# Patient Record
Sex: Male | Born: 1955 | Race: White | Hispanic: No | Marital: Single | Smoking: Former smoker
Health system: Southern US, Community
[De-identification: ages and names within clinical notes are randomized; demographics above are authoritative.]

## PROBLEM LIST (undated history)

## (undated) DIAGNOSIS — G8929 Other chronic pain: Secondary | ICD-10-CM

## (undated) DIAGNOSIS — M549 Dorsalgia, unspecified: Secondary | ICD-10-CM

## (undated) DIAGNOSIS — E785 Hyperlipidemia, unspecified: Secondary | ICD-10-CM

## (undated) HISTORY — DX: Hyperlipidemia, unspecified: E78.5

---

## 2008-05-31 ENCOUNTER — Encounter: Admission: RE | Admit: 2008-05-31 | Discharge: 2008-05-31 | Payer: Self-pay | Admitting: Orthopaedic Surgery

## 2008-06-22 ENCOUNTER — Ambulatory Visit (HOSPITAL_BASED_OUTPATIENT_CLINIC_OR_DEPARTMENT_OTHER): Admission: RE | Admit: 2008-06-22 | Discharge: 2008-06-22 | Payer: Self-pay | Admitting: Orthopaedic Surgery

## 2008-06-22 ENCOUNTER — Encounter (INDEPENDENT_AMBULATORY_CARE_PROVIDER_SITE_OTHER): Payer: Self-pay | Admitting: Orthopaedic Surgery

## 2010-11-14 NOTE — Op Note (Signed)
NAMELEOTHA, VOELTZ                ACCOUNT NO.:  [id number]   MEDICAL RECORD NO.:  [id number]          PATIENT TYPE:  AMB   LOCATION:  DSC                          FACILITY:  [facility]   PHYSICIAN:  Lubertha Basque. Dalldorf, M.D.DATE OF BIRTH:  Aug 03, 1955   DATE OF PROCEDURE:  06/22/2008  DATE OF DISCHARGE:                               OPERATIVE REPORT   PREOPERATIVE DIAGNOSIS:  Left low back lipoma.   POSTOPERATIVE DIAGNOSIS:  Left low back lipoma.   PROCEDURE:  Excision of lipoma, low back area.   ANESTHESIA:  General.   ATTENDING SURGEON:  Lubertha Basque. Jerl Santos, MD   ASSISTANT:  Lindwood Qua, PA   INDICATIONS FOR PROCEDURE:  The patient is a 55 year old man with long  history of a mobile, painful mass on the left side of his low back.  This makes it difficult for him to sit and exercise.  By MRI scan, this  does not look as though there is pathologic process going on, but with  continued discomfort, he is offered exploration and removal of the  palpable mass.  Informed operative consent was obtained after discussion  of possible complications of reaction to anesthesia, infection, and  recurrence.   SUMMARY, FINDINGS, AND PROCEDURE:  Under general anesthesia through a  small transverse incision, we removed a 4 x 3 x 3 cm fatty mass and sent  this to pathology.  This appeared completely benign.   DESCRIPTION OF PROCEDURE:  The patient was brought to the operating  suite where general anesthetic was applied without difficulty.  He was  positioned prone with chest rolls utilized.  He was then prepped and  draped in normal sterile fashion.  After administration of IV Kefzol, a  small transverse incision was made about the left side of low back with  dissection down to a mobile wad of fatty tissue.  This measured about 4  x 3 x 3 cm and was removed and sent to pathology in formalin.  The wound  was irrigated followed by reapproximation of deep tissues with Vicryl in  hopes of  eliminating dead space.  We then performed subcutaneous  reapproximation with 2-0 undyed Vicryl and skin closure with some nylon.  Marcaine was injected followed by Adaptic and dry gauze dressing with  tape.  Estimated blood loss and intraoperative fluids obtained from  anesthesia records.   DISPOSITION:  The patient was rolled back in supine position and  extubated in uncomplicated fashion.  He was taken to recovery room in  stable condition.  Plans were for him to go home same day and follow up  with me in the office less than week.  I will contact him by phone  tonight.      Lubertha Basque Jerl Santos, M.D.  Electronically Signed     PGD/MEDQ  D:  06/22/2008  T:  06/22/2008  Job:  [id number]

## 2012-08-24 ENCOUNTER — Emergency Department (HOSPITAL_COMMUNITY): Payer: BC Managed Care – PPO

## 2012-08-24 ENCOUNTER — Emergency Department (HOSPITAL_COMMUNITY)
Admission: EM | Admit: 2012-08-24 | Discharge: 2012-08-24 | Disposition: A | Discharge status: Home / Self Care | Payer: BC Managed Care – PPO | Attending: Emergency Medicine | Admitting: Emergency Medicine

## 2012-08-24 ENCOUNTER — Encounter (HOSPITAL_COMMUNITY): Payer: Self-pay | Admitting: *Deleted

## 2012-08-24 DIAGNOSIS — M545 Low back pain, unspecified: Secondary | ICD-10-CM

## 2012-08-24 DIAGNOSIS — M79609 Pain in unspecified limb: Secondary | ICD-10-CM | POA: Insufficient documentation

## 2012-08-24 DIAGNOSIS — Z8739 Personal history of other diseases of the musculoskeletal system and connective tissue: Secondary | ICD-10-CM | POA: Insufficient documentation

## 2012-08-24 MED ORDER — HYDROMORPHONE HCL PF 1 MG/ML IJ SOLN
1.0000 mg | Freq: Once | INTRAMUSCULAR | Status: AC
  Administered 2012-08-24: 1 mg via INTRAMUSCULAR
  Filled 2012-08-24: qty 1

## 2012-08-24 MED ORDER — OXYCODONE-ACETAMINOPHEN 5-325 MG PO TABS
1.0000 | ORAL_TABLET | Freq: Once | ORAL | Status: AC
  Administered 2012-08-24: 1 via ORAL
  Filled 2012-08-24: qty 1

## 2012-08-24 MED ORDER — CYCLOBENZAPRINE HCL 10 MG PO TABS: 10.0000 mg | ORAL_TABLET | Freq: Three times a day (TID) | ORAL | Status: DC | PRN

## 2012-08-24 MED ORDER — IBUPROFEN 800 MG PO TABS: 800.0000 mg | ORAL_TABLET | Freq: Three times a day (TID) | ORAL | Status: DC

## 2012-08-24 MED ORDER — OXYCODONE-ACETAMINOPHEN 5-325 MG PO TABS: 2.0000 | ORAL_TABLET | ORAL | Status: DC | PRN

## 2012-08-24 NOTE — ED Notes (Signed)
Pt states that he was raking leafs today and his back was aching and has been for some time now.  Pt says he had MRI years ago and has degenerative disc disease

## 2012-08-24 NOTE — ED Provider Notes (Signed)
History     CSN: [id number]  Arrival date & time 08/24/12  0037   First MD Initiated Contact with Patient 08/24/12 0245      Chief Complaint  Patient presents with  . Back Pain  . Leg Pain    (Consider location/radiation/quality/duration/timing/severity/associated sxs/prior treatment) HPI Hx per PT- R sided LBP for the last week, mild and he thought he needed exercise. Today raking leaves and tonight pain more severe, sharp in quality and radiates to R thigh. No weakness or numbness. Had MRI 8-9 years ago for L sided LBP. No sig issues since then. No F/C. No DM or incontinence. No saddle paraesthesias. Pain constant. Hurts to move, twist or bend.    No past medical history on file.  No past surgical history on file.  History reviewed. No pertinent family history.  History  Substance Use Topics  . Smoking status: Not on file  . Smokeless tobacco: Not on file  . Alcohol Use: Yes     Comment: rarely      Review of Systems  Constitutional: Negative for fever and chills.  HENT: Negative for neck pain and neck stiffness.   Eyes: Negative for pain.  Respiratory: Negative for shortness of breath.   Cardiovascular: Negative for chest pain.  Gastrointestinal: Negative for abdominal pain.  Genitourinary: Negative for dysuria.  Musculoskeletal: Positive for back pain.  Skin: Negative for rash.  Neurological: Negative for headaches.  All other systems reviewed and are negative.    Allergies  Review of patient's allergies indicates no known allergies.  Home Medications  No current outpatient prescriptions on file.  BP 142/58  Pulse 70  Temp(Src) 97.4 F (36.3 C) (Oral)  Resp 16  SpO2 100%  Physical Exam  Constitutional: He is oriented to person, place, and time. He appears well-developed and well-nourished.  HENT:  Head: Normocephalic and atraumatic.  Eyes: EOM are normal. Pupils are equal, round, and reactive to light.  Neck: Neck supple.  No c spine tenderness   Cardiovascular: Regular rhythm and intact distal pulses.   Pulmonary/Chest: Effort normal. No respiratory distress.  Abdominal: Soft. Bowel sounds are normal. He exhibits no distension. There is no tenderness.  Musculoskeletal: Normal range of motion. He exhibits no edema.  TTP R paralumbar, no midline deformity. No LE deficits with equal strengths, DTRs and sensorium to light touch.   Neurological: He is alert and oriented to person, place, and time.  Skin: Skin is warm and dry.    ED Course  Procedures (including critical care time)  Results for orders placed during the hospital encounter of 06/22/08  POCT HEMOGLOBIN-HEMACUE      Result Value Range   Hemoglobin 13.0  13.0 - 17.0 g/dL   Dg Lumbar Spine Complete  08/24/2012  *RADIOLOGY REPORT*  Clinical Data: Low back pain, worse on the right.  Leg pain.  No known injury.  LUMBAR SPINE - COMPLETE 4+ VIEW  Comparison: MRI lumbar spine 05/31/2008  Findings: Five lumbar type vertebral bodies.  The lumbar vertebrae and facet joints demonstrate normal alignment.  Degenerative changes present with endplate hypertrophic changes on the vertebral.  No significant disc space narrowing.  Degenerative changes in the facet joints.  No vertebral compression deformities. No focal bone lesion or bone destruction.  Bone cortex and trabecular architecture appear intact.  IMPRESSION: Mild degenerative changes in the lumbar spine.  No displaced fractures identified.   Original Report Authenticated By: Burman Nieves, M.D.     Percocet PO - recheck unchanged. IM  Dilaudid provided.   3:55 AM pain improving - xray results shared with PT MDM  Back pain without defictis  Evaluated with imaging reviewed as above  Improved with medications and IM narcotics  VS and nursing notes reviewed  Plan outpatient f/u with back pain precautions provided     Sunnie Nielsen, MD 08/24/12 [phone]

## 2012-08-29 ENCOUNTER — Emergency Department (HOSPITAL_COMMUNITY)
Admission: EM | Admit: 2012-08-29 | Discharge: 2012-08-29 | Disposition: A | Discharge status: Home / Self Care | Payer: BC Managed Care – PPO | Attending: Emergency Medicine | Admitting: Emergency Medicine

## 2012-08-29 ENCOUNTER — Encounter (HOSPITAL_COMMUNITY): Payer: Self-pay | Admitting: *Deleted

## 2012-08-29 DIAGNOSIS — M79609 Pain in unspecified limb: Secondary | ICD-10-CM | POA: Insufficient documentation

## 2012-08-29 DIAGNOSIS — G8929 Other chronic pain: Secondary | ICD-10-CM | POA: Insufficient documentation

## 2012-08-29 DIAGNOSIS — R209 Unspecified disturbances of skin sensation: Secondary | ICD-10-CM | POA: Insufficient documentation

## 2012-08-29 DIAGNOSIS — M79604 Pain in right leg: Secondary | ICD-10-CM

## 2012-08-29 DIAGNOSIS — Z87891 Personal history of nicotine dependence: Secondary | ICD-10-CM | POA: Insufficient documentation

## 2012-08-29 DIAGNOSIS — Z8739 Personal history of other diseases of the musculoskeletal system and connective tissue: Secondary | ICD-10-CM | POA: Insufficient documentation

## 2012-08-29 DIAGNOSIS — IMO0002 Reserved for concepts with insufficient information to code with codable children: Secondary | ICD-10-CM | POA: Insufficient documentation

## 2012-08-29 HISTORY — DX: Dorsalgia, unspecified: M54.9

## 2012-08-29 HISTORY — DX: Other chronic pain: G89.29

## 2012-08-29 LAB — BASIC METABOLIC PANEL
BUN: 17 mg/dL (ref 6–23)
CO2: 24 mEq/L (ref 19–32)
Potassium: 4.2 mEq/L (ref 3.5–5.1)

## 2012-08-29 LAB — CBC
MCV: 90.5 fL (ref 78.0–100.0)
Platelets: 240 10*3/uL (ref 150–400)
RBC: 4.42 MIL/uL (ref 4.22–5.81)
WBC: 9.8 10*3/uL (ref 4.0–10.5)

## 2012-08-29 MED ORDER — ENOXAPARIN SODIUM 100 MG/ML ~~LOC~~ SOLN
100.0000 mg | Freq: Once | SUBCUTANEOUS | Status: AC
  Administered 2012-08-29: 100 mg via SUBCUTANEOUS
  Filled 2012-08-29: qty 1

## 2012-08-29 NOTE — ED Provider Notes (Signed)
History     CSN: [id number]  Arrival date & time 08/29/12  1941   First MD Initiated Contact with Patient 08/29/12 2040      Chief Complaint  Patient presents with  . Knee Pain    (Consider location/radiation/quality/duration/timing/severity/associated sxs/prior treatment) HPI History provided by pt.   Pt has had pain in right calf and posterior thigh for the past 6 days.  Aggravated by palpation and associated w/ tingling sensation in same location.  Is currently on a steroid for low back pain, which has resolved, but has not taken any other analgesics.  Concerned he may have a blood clot.  No prior history or recent travel/hospitalization/surgery.  Denies trauma but has been doing exercises for chronic right knee pain.   Past Medical History  Diagnosis Date  . Chronic back pain     History reviewed. No pertinent past surgical history.  History reviewed. No pertinent family history.  History  Substance Use Topics  . Smoking status: Former Games developer  . Smokeless tobacco: Current User    Types: Snuff  . Alcohol Use: Yes     Comment: rarely      Review of Systems  All other systems reviewed and are negative.    Allergies  Review of patient's allergies indicates no known allergies.  Home Medications   Current Outpatient Rx  Name  Route  Sig  Dispense  Refill  . ibuprofen (ADVIL,MOTRIN) 800 MG tablet   Oral   Take 1 tablet (800 mg total) by mouth 3 (three) times daily.   21 tablet   0   . oxyCODONE-acetaminophen (PERCOCET/ROXICET) 5-325 MG per tablet   Oral   Take 2 tablets by mouth every 4 (four) hours as needed for pain.         . predniSONE (STERAPRED UNI-PAK) 10 MG tablet   Oral   Take 10-60 mg by mouth daily. Taper down instructions for 6 day regimen           BP 143/82  Pulse 82  Temp(Src) 98.2 F (36.8 C) (Oral)  Resp 20  SpO2 99%  Physical Exam  Nursing note and vitals reviewed. Constitutional: He is oriented to person, place, and time.  He appears well-developed and well-nourished. No distress.  HENT:  Head: Normocephalic and atraumatic.  Eyes:  Normal appearance  Neck: Normal range of motion.  Cardiovascular: Normal rate and regular rhythm.   Pulmonary/Chest: Effort normal and breath sounds normal. No respiratory distress.  Musculoskeletal: Normal range of motion.  No edema or skin changes of RLE.  Mild tenderness postero-medial aspect of calf and thigh.  No pain w/ ROM of ankle/knee.  2+ DP pulse and distal sensation intact.  Lumbar spine non-tender.   Neurological: He is alert and oriented to person, place, and time.  Skin: Skin is warm and dry. No rash noted.  Psychiatric: He has a normal mood and affect. His behavior is normal.    ED Course  Procedures (including critical care time)  Labs Reviewed - No data to display No results found.   1. Leg pain, right       MDM  56yo M presents w/ non-traumatic RLE pain.  He is concerned he may have a DVT.  No risk factors and low clinical suspicion.  Pt received subq lovenox and was scheduled for an outpatient venous duplex.  Recommended ice/heat and tylenol (currently on prednisone) to treat what is likely a muscle strain and f/u with PCP if [place] negative.  Return precautions  discussed. 9:11 PM         Otilio Miu, PA-C 08/29/12 2113

## 2012-08-29 NOTE — ED Provider Notes (Signed)
Medical screening examination/treatment/procedure(s) were performed by non-physician practitioner and as supervising physician I was immediately available for consultation/collaboration.  Ethelda Chick, MD 08/29/12 2122

## 2012-08-29 NOTE — ED Notes (Signed)
PT c/o R knee pain and tingling radiating down R calf. Pt c/o swelling to medial R knee, heat, swelling, painful to touch.

## 2012-08-30 ENCOUNTER — Ambulatory Visit (HOSPITAL_COMMUNITY): Payer: BC Managed Care – PPO | Attending: Emergency Medicine

## 2012-09-03 ENCOUNTER — Other Ambulatory Visit: Payer: Self-pay | Admitting: Family Medicine

## 2012-09-03 DIAGNOSIS — M545 Low back pain, unspecified: Secondary | ICD-10-CM

## 2012-09-03 DIAGNOSIS — M25561 Pain in right knee: Secondary | ICD-10-CM

## 2012-09-12 ENCOUNTER — Ambulatory Visit
Admission: RE | Admit: 2012-09-12 | Discharge: 2012-09-12 | Disposition: A | Discharge status: Home / Self Care | Payer: BC Managed Care – PPO | Source: Ambulatory Visit | Attending: Family Medicine | Admitting: Family Medicine

## 2012-09-12 DIAGNOSIS — M25561 Pain in right knee: Secondary | ICD-10-CM

## 2012-09-12 DIAGNOSIS — M545 Low back pain, unspecified: Secondary | ICD-10-CM

## 2012-09-13 ENCOUNTER — Other Ambulatory Visit: Payer: BC Managed Care – PPO

## 2012-09-22 ENCOUNTER — Ambulatory Visit (HOSPITAL_COMMUNITY): Admission: RE | Admit: 2012-09-22 | Payer: BC Managed Care – PPO | Source: Ambulatory Visit

## 2013-09-01 ENCOUNTER — Other Ambulatory Visit: Payer: Self-pay | Admitting: Family Medicine

## 2013-09-01 DIAGNOSIS — R1011 Right upper quadrant pain: Secondary | ICD-10-CM

## 2013-09-03 ENCOUNTER — Ambulatory Visit
Admission: RE | Admit: 2013-09-03 | Discharge: 2013-09-03 | Disposition: A | Discharge status: Home / Self Care | Payer: BC Managed Care – PPO | Source: Ambulatory Visit | Attending: Family Medicine | Admitting: Family Medicine

## 2013-09-03 DIAGNOSIS — R1011 Right upper quadrant pain: Secondary | ICD-10-CM

## 2013-09-17 ENCOUNTER — Encounter (INDEPENDENT_AMBULATORY_CARE_PROVIDER_SITE_OTHER): Payer: Self-pay | Admitting: Surgery

## 2013-09-28 ENCOUNTER — Ambulatory Visit (INDEPENDENT_AMBULATORY_CARE_PROVIDER_SITE_OTHER): Payer: BC Managed Care – PPO | Admitting: Surgery

## 2013-10-12 ENCOUNTER — Ambulatory Visit (INDEPENDENT_AMBULATORY_CARE_PROVIDER_SITE_OTHER): Payer: BC Managed Care – PPO | Admitting: Surgery

## 2013-10-12 ENCOUNTER — Encounter (INDEPENDENT_AMBULATORY_CARE_PROVIDER_SITE_OTHER): Payer: Self-pay | Admitting: Surgery

## 2013-10-12 VITALS — BP 124/78 | HR 79 | Temp 97.6°F | Resp 16 | Ht 67.0 in | Wt 228.0 lb

## 2013-10-12 DIAGNOSIS — K8020 Calculus of gallbladder without cholecystitis without obstruction: Secondary | ICD-10-CM | POA: Insufficient documentation

## 2013-10-12 NOTE — Progress Notes (Signed)
Patient ID: Rodney Houghhomas Awan, male   DOB: 1955-08-20, 58 y.o.   MRN: [id number]  Chief Complaint  Patient presents with  . Other    gallstones    HPI Rodney Quinn is a 58 y.o. male.   HPI This is a pleasant gentleman referred by Dr. Tenny Crawoss for evaluation of right upper quadrant abdominal pain. He has had 2 attacks of right upper quadrant abdominal pain and nausea after fatty meals. The pain was sharp and moderate in intensity. It did not perforate where else. There was no emesis. He denies jaundice. Today he is currently pain-free. Past Medical History  Diagnosis Date  . Chronic back pain   . Hyperlipidemia     History reviewed. No pertinent past surgical history.  Family History  Problem Relation Age of Onset  . Cancer Mother     colon    Social History History  Substance Use Topics  . Smoking status: Former Games developermoker  . Smokeless tobacco: Current User    Types: Snuff  . Alcohol Use: Yes     Comment: rarely    No Known Allergies  Current Outpatient Prescriptions  Medication Sig Dispense Refill  . beta carotene w/minerals (OCUVITE) tablet Take 1 tablet by mouth daily.       No current facility-administered medications for this visit.    Review of Systems Review of Systems  Constitutional: Negative for fever, chills and unexpected weight change.  HENT: Negative for congestion, hearing loss, sore throat, trouble swallowing and voice change.   Eyes: Negative for visual disturbance.  Respiratory: Negative for cough and wheezing.   Cardiovascular: Negative for chest pain, palpitations and leg swelling.  Gastrointestinal: Positive for nausea and abdominal pain. Negative for vomiting, diarrhea, constipation, blood in stool, abdominal distention, anal bleeding and rectal pain.  Genitourinary: Negative for hematuria and difficulty urinating.  Musculoskeletal: Negative for arthralgias.  Skin: Negative for rash and wound.  Neurological: Negative for seizures, syncope, weakness and  headaches.  Hematological: Negative for adenopathy. Does not bruise/bleed easily.  Psychiatric/Behavioral: Negative for confusion.    Blood pressure 124/78, pulse 79, temperature 97.6 F (36.4 C), resp. rate 16, height 5\' 7"  (1.702 m), weight 228 lb (103.42 kg).  Physical Exam Physical Exam  Constitutional: He is oriented to person, place, and time. He appears well-developed and well-nourished. No distress.  HENT:  Head: Normocephalic and atraumatic.  Right Ear: External ear normal.  Left Ear: External ear normal.  Nose: Nose normal.  Mouth/Throat: Oropharynx is clear and moist. No oropharyngeal exudate.  Eyes: Conjunctivae are normal. Pupils are equal, round, and reactive to light. Right eye exhibits no discharge. Left eye exhibits no discharge. No scleral icterus.  Neck: Normal range of motion. Neck supple. No tracheal deviation present.  Cardiovascular: Normal rate, regular rhythm, normal heart sounds and intact distal pulses.   No murmur heard. Pulmonary/Chest: Effort normal and breath sounds normal. No respiratory distress. He has no wheezes. He has no rales.  Abdominal: Soft. Bowel sounds are normal. He exhibits no distension. There is no tenderness. There is no rebound.  Musculoskeletal: Normal range of motion. He exhibits no edema and no tenderness.  Lymphadenopathy:    He has no cervical adenopathy.  Neurological: He is alert and oriented to person, place, and time.  Skin: Skin is warm and dry. He is not diaphoretic. No erythema.  Psychiatric: His behavior is normal. Judgment normal.    Data Reviewed Ultrasound shows multiple gallstones with an irregular gallbladder wall. The bile ducts are normal  Assessment    Symptomatically Cholelithiasis     Plan    I discussed the diagnosis with the patient. I recommend laparoscopic cholecystectomy. I gave him literature regarding the surgery. I discussed the risks of problems if surgery is not undertaken. I discussed the risks  of surgery which includes but is not limited to bleeding, infection, bile duct injury, bile leak, injury to other structures, and need to convert to an open procedure, postoperative recovery, et Karie Sodacetera. He understands and wished to proceed the surgery which will be scheduled        Shelly RubensteinDouglas A Roneshia Drew 10/12/2013, 11:14 AM

## 2013-10-15 ENCOUNTER — Other Ambulatory Visit (INDEPENDENT_AMBULATORY_CARE_PROVIDER_SITE_OTHER): Payer: Self-pay | Admitting: Surgery

## 2013-10-15 DIAGNOSIS — K8010 Calculus of gallbladder with chronic cholecystitis without obstruction: Secondary | ICD-10-CM

## 2013-10-19 ENCOUNTER — Other Ambulatory Visit (INDEPENDENT_AMBULATORY_CARE_PROVIDER_SITE_OTHER): Payer: Self-pay

## 2013-10-19 MED ORDER — HYDROCODONE-ACETAMINOPHEN 5-325 MG PO TABS: 1.0000 | ORAL_TABLET | Freq: Four times a day (QID) | ORAL | Status: DC | PRN

## 2013-11-04 ENCOUNTER — Encounter (INDEPENDENT_AMBULATORY_CARE_PROVIDER_SITE_OTHER): Payer: BC Managed Care – PPO | Admitting: Surgery

## 2013-11-17 ENCOUNTER — Encounter (INDEPENDENT_AMBULATORY_CARE_PROVIDER_SITE_OTHER): Payer: BC Managed Care – PPO | Admitting: Surgery

## 2013-11-24 ENCOUNTER — Ambulatory Visit (INDEPENDENT_AMBULATORY_CARE_PROVIDER_SITE_OTHER): Payer: BC Managed Care – PPO | Admitting: Surgery

## 2013-11-24 ENCOUNTER — Encounter (INDEPENDENT_AMBULATORY_CARE_PROVIDER_SITE_OTHER): Payer: Self-pay | Admitting: Surgery

## 2013-11-24 VITALS — BP 108/70 | HR 80 | Temp 97.8°F | Resp 16 | Ht 67.0 in | Wt 226.0 lb

## 2013-11-24 DIAGNOSIS — Z09 Encounter for follow-up examination after completed treatment for conditions other than malignant neoplasm: Secondary | ICD-10-CM

## 2013-11-24 NOTE — Progress Notes (Signed)
Subjective:     Patient ID: Rodney Quinn, male   DOB: 09/11/1955, 58 y.o.   MRN: [id number]  HPI He is here for his first postop visit status post laparoscopic cholecystectomy. He is doing well and has no complaints.  Review of Systems     Objective:   Physical Exam On exam, his incisions are healing well. The final pathology showed chronic cholecystitis with gallstones    Assessment:     Patient stable postop     Plan:     He may resume his normal activity. I will see him back as needed

## 2014-03-16 IMAGING — US US ABDOMEN COMPLETE
1 series · 13 of 25 positions shown · non-contrast
Comparison: None.

CLINICAL DATA: Right upper quadrant abdominal pain, some nausea and
vomiting

EXAM:
ULTRASOUND ABDOMEN COMPLETE

[Series 1: us abdomen complete · 0.35mm/px · 13 of 66 slices shown]
[im 1/66]
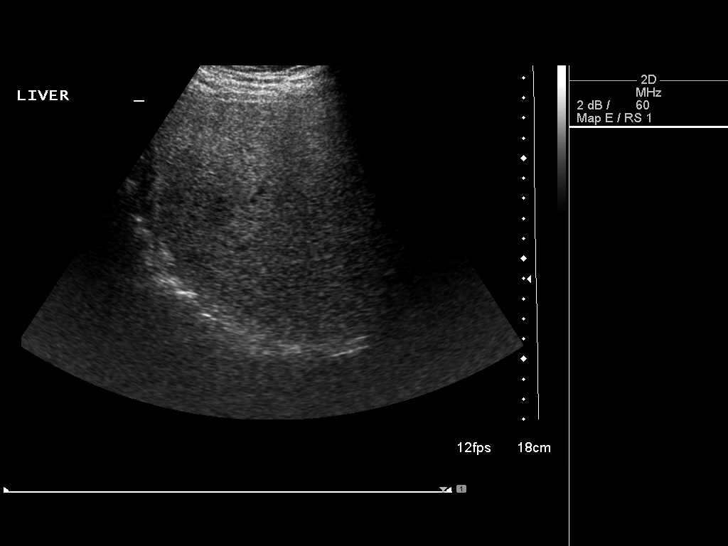
[im 6/66]
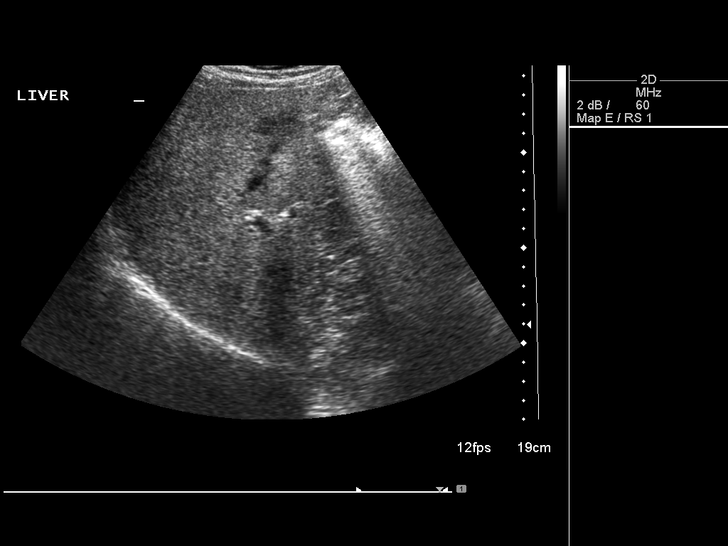
[im 11/66]
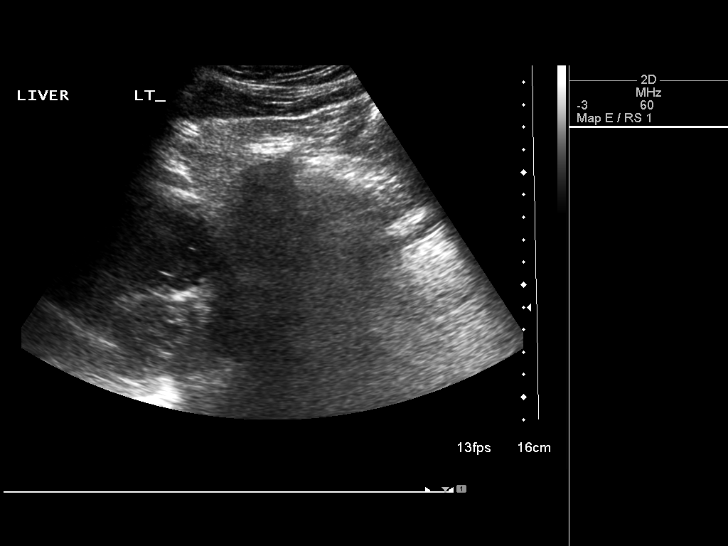
[im 17/66]
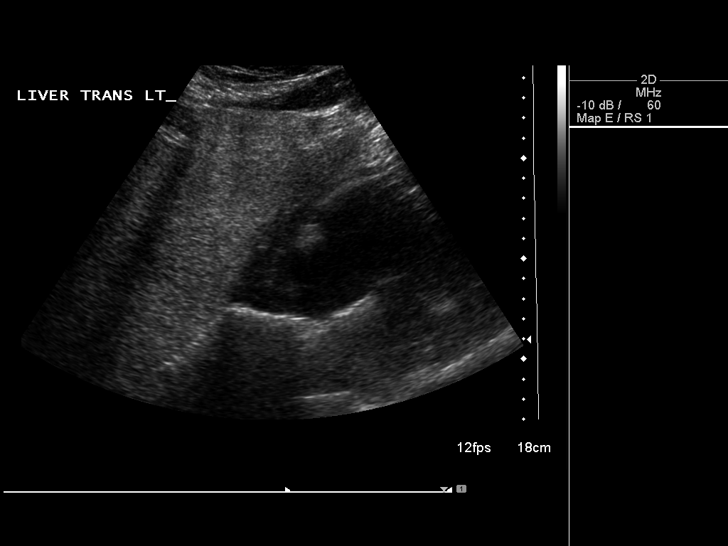
[im 22/66]
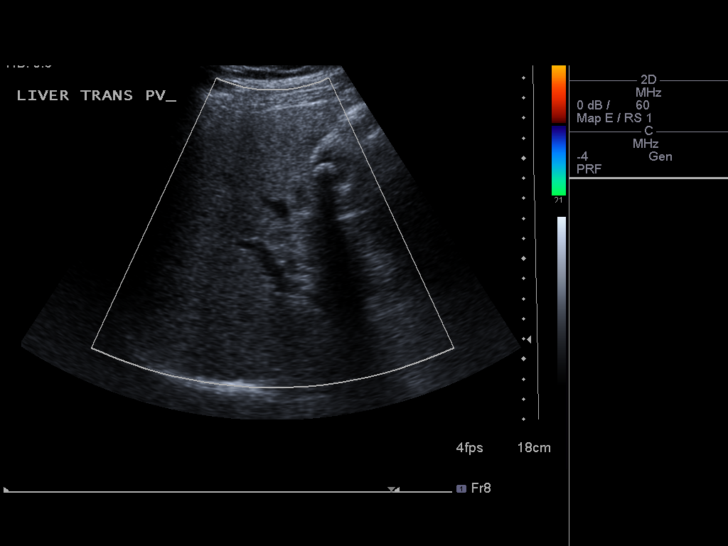
[im 28/66]
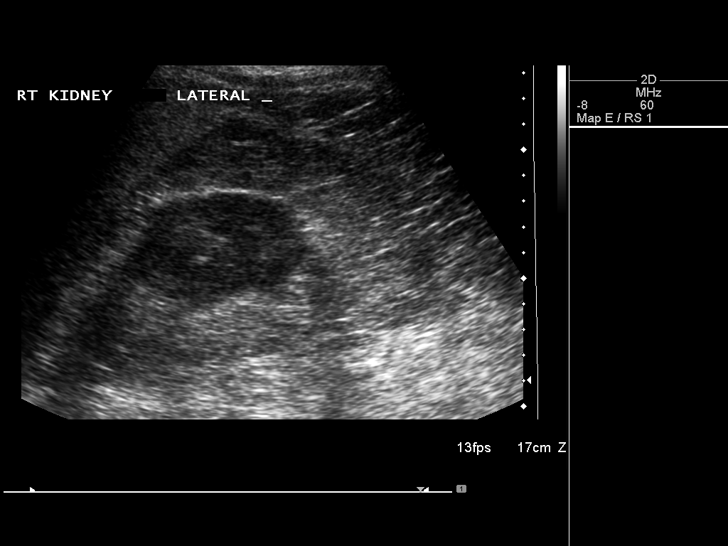
[im 33/66]
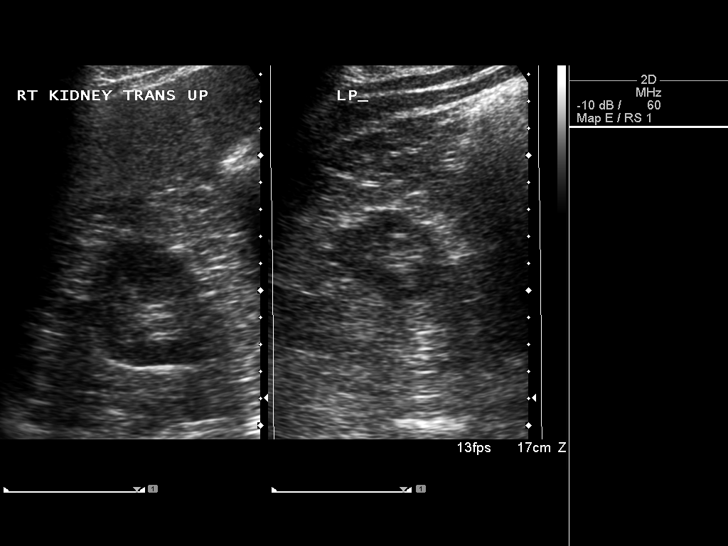
[im 38/66]
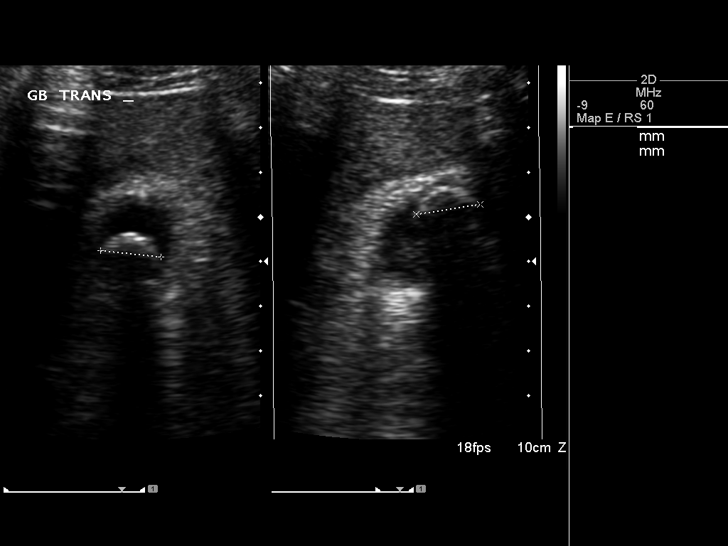
[im 44/66]
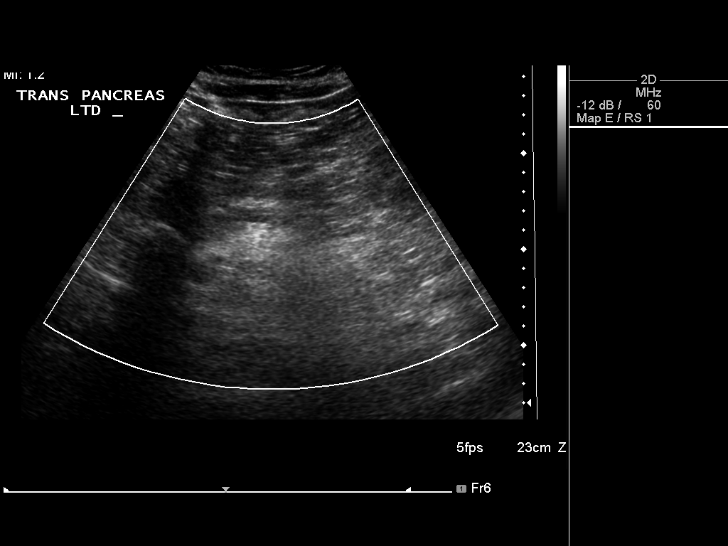
[im 49/66]
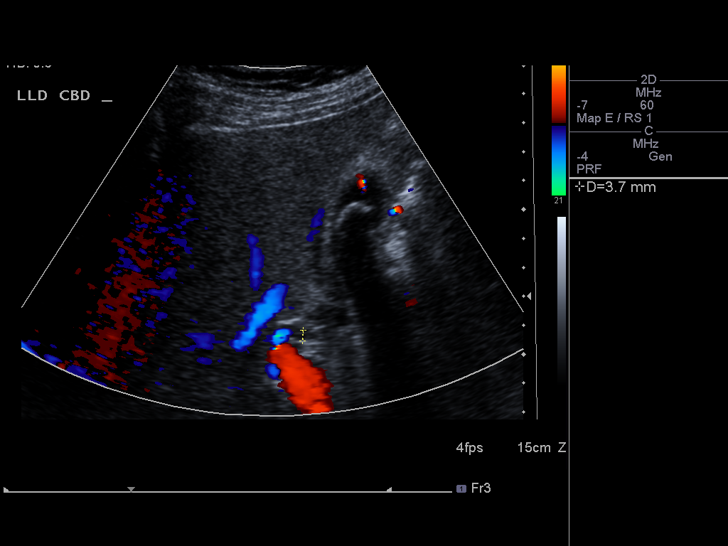
[im 55/66]
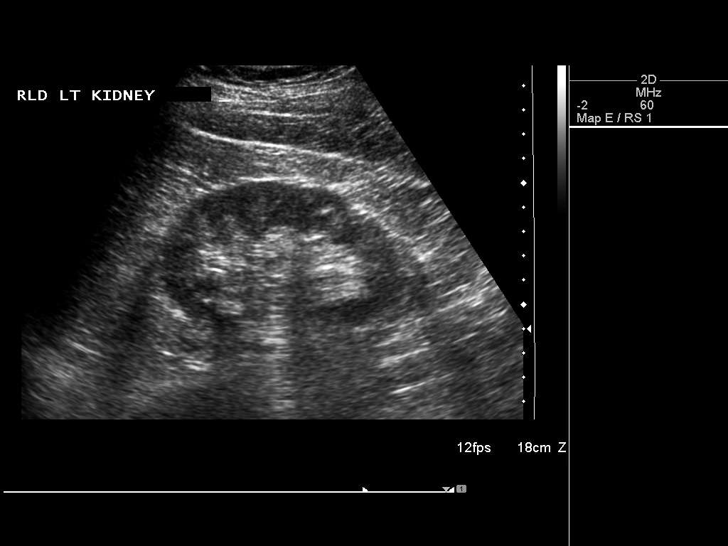
[im 60/66]
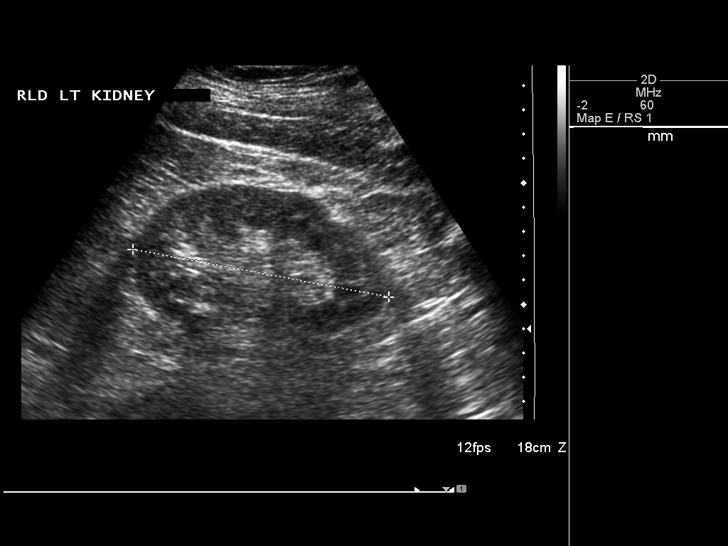
[im 66/66]
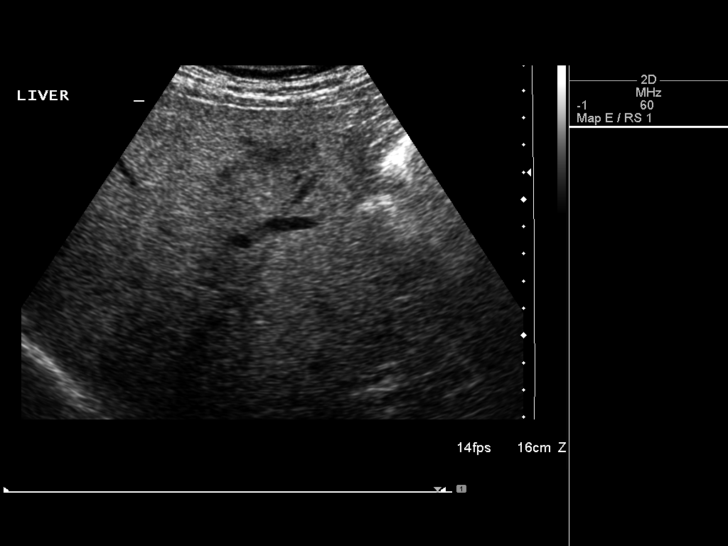

[13 of 25 positions shown; findings below may reference images not displayed]

FINDINGS: Gallbladder:

The gallbladder is visualized and there are multiple gallstones
present within the gallbladder. The gallbladder wall is slightly
prominent and irregular, but there is no pain over the gallbladder
currently. Developing cholecystitis cannot be excluded.

Common bile duct:

Diameter: The common bile duct is normal measuring 3.7 mm in
diameter.

Liver:

The liver is somewhat inhomogeneous and echogenic consistent with
fatty infiltration with probable sparing near the gallbladder. No
focal hepatic abnormality is seen.

IVC:

IVC is obscured by bowel gas.

Pancreas:

The pancreas also is obscured by bowel gas and cannot be well
evaluated.

Spleen:

The spleen is normal measuring 3.9 cm sagittally.

Right Kidney:

Length: The right kidney measures 11.3 cm sagittally.. No
hydronephrosis is noted.

Left Kidney:

Length: The left kidney measures 10.7 cm.. No hydronephrosis is
seen.

Abdominal aorta:

The abdominal aorta is normal caliber are although partially
obscured by bowel gas.

Other findings:

None.
IMPRESSION: 1. Gallstones with slightly thickened gallbladder wall but no pain
over the gallbladder with compression. Cannot exclude developing
cholecystitis. Correlate clinically.
2. Echogenic inhomogeneous liver consistent with fatty infiltration.
3. The pancreas is obscured by bowel gas as are portions of the
abdominal aorta.

## 2014-09-07 ENCOUNTER — Other Ambulatory Visit: Payer: Self-pay | Admitting: Family Medicine

## 2017-03-21 ENCOUNTER — Other Ambulatory Visit: Payer: Self-pay | Admitting: Family Medicine

## 2017-03-21 ENCOUNTER — Ambulatory Visit
Admission: RE | Admit: 2017-03-21 | Discharge: 2017-03-21 | Disposition: A | Discharge status: Home / Self Care | Payer: BLUE CROSS/BLUE SHIELD | Source: Ambulatory Visit | Attending: Family Medicine | Admitting: Family Medicine

## 2017-03-21 DIAGNOSIS — M25561 Pain in right knee: Secondary | ICD-10-CM | POA: Diagnosis not present

## 2017-03-21 DIAGNOSIS — S2232XA Fracture of one rib, left side, initial encounter for closed fracture: Secondary | ICD-10-CM | POA: Diagnosis not present

## 2017-03-21 DIAGNOSIS — W19XXXA Unspecified fall, initial encounter: Secondary | ICD-10-CM

## 2017-03-21 DIAGNOSIS — R0781 Pleurodynia: Secondary | ICD-10-CM | POA: Diagnosis not present

## 2017-05-22 DIAGNOSIS — T1591XA Foreign body on external eye, part unspecified, right eye, initial encounter: Secondary | ICD-10-CM | POA: Diagnosis not present

## 2017-07-08 DIAGNOSIS — H6502 Acute serous otitis media, left ear: Secondary | ICD-10-CM | POA: Diagnosis not present

## 2017-07-15 DIAGNOSIS — H6980 Other specified disorders of Eustachian tube, unspecified ear: Secondary | ICD-10-CM | POA: Diagnosis not present

## 2017-07-31 DIAGNOSIS — J320 Chronic maxillary sinusitis: Secondary | ICD-10-CM | POA: Diagnosis not present

## 2017-08-07 DIAGNOSIS — R1310 Dysphagia, unspecified: Secondary | ICD-10-CM | POA: Diagnosis not present

## 2017-08-08 DIAGNOSIS — R1310 Dysphagia, unspecified: Secondary | ICD-10-CM | POA: Diagnosis not present

## 2017-08-12 DIAGNOSIS — K449 Diaphragmatic hernia without obstruction or gangrene: Secondary | ICD-10-CM | POA: Diagnosis not present

## 2017-08-12 DIAGNOSIS — R1310 Dysphagia, unspecified: Secondary | ICD-10-CM | POA: Diagnosis not present

## 2017-09-16 DIAGNOSIS — J329 Chronic sinusitis, unspecified: Secondary | ICD-10-CM | POA: Diagnosis not present

## 2017-09-16 DIAGNOSIS — J320 Chronic maxillary sinusitis: Secondary | ICD-10-CM | POA: Diagnosis not present

## 2017-10-17 DIAGNOSIS — L989 Disorder of the skin and subcutaneous tissue, unspecified: Secondary | ICD-10-CM | POA: Diagnosis not present

## 2018-07-16 DIAGNOSIS — H353131 Nonexudative age-related macular degeneration, bilateral, early dry stage: Secondary | ICD-10-CM | POA: Diagnosis not present

## 2018-12-04 DIAGNOSIS — H2513 Age-related nuclear cataract, bilateral: Secondary | ICD-10-CM | POA: Diagnosis not present

## 2018-12-04 DIAGNOSIS — H5203 Hypermetropia, bilateral: Secondary | ICD-10-CM | POA: Diagnosis not present

## 2018-12-29 DIAGNOSIS — L720 Epidermal cyst: Secondary | ICD-10-CM | POA: Diagnosis not present

## 2019-04-09 DIAGNOSIS — H353131 Nonexudative age-related macular degeneration, bilateral, early dry stage: Secondary | ICD-10-CM | POA: Diagnosis not present

## 2019-08-13 DIAGNOSIS — Z800 Family history of malignant neoplasm of digestive organs: Secondary | ICD-10-CM | POA: Diagnosis not present

## 2019-08-13 DIAGNOSIS — Z1211 Encounter for screening for malignant neoplasm of colon: Secondary | ICD-10-CM | POA: Diagnosis not present

## 2019-08-13 DIAGNOSIS — D125 Benign neoplasm of sigmoid colon: Secondary | ICD-10-CM | POA: Diagnosis not present

## 2019-08-13 DIAGNOSIS — K635 Polyp of colon: Secondary | ICD-10-CM | POA: Diagnosis not present

## 2019-11-27 DIAGNOSIS — H353132 Nonexudative age-related macular degeneration, bilateral, intermediate dry stage: Secondary | ICD-10-CM | POA: Diagnosis not present

## 2019-12-29 DIAGNOSIS — H9202 Otalgia, left ear: Secondary | ICD-10-CM | POA: Diagnosis not present

## 2019-12-29 DIAGNOSIS — R030 Elevated blood-pressure reading, without diagnosis of hypertension: Secondary | ICD-10-CM | POA: Diagnosis not present

## 2019-12-29 DIAGNOSIS — Z131 Encounter for screening for diabetes mellitus: Secondary | ICD-10-CM | POA: Diagnosis not present

## 2019-12-29 DIAGNOSIS — Z13220 Encounter for screening for lipoid disorders: Secondary | ICD-10-CM | POA: Diagnosis not present

## 2020-04-13 DIAGNOSIS — M25561 Pain in right knee: Secondary | ICD-10-CM | POA: Diagnosis not present

## 2020-04-13 DIAGNOSIS — Z23 Encounter for immunization: Secondary | ICD-10-CM | POA: Diagnosis not present

## 2020-04-13 DIAGNOSIS — Z0000 Encounter for general adult medical examination without abnormal findings: Secondary | ICD-10-CM | POA: Diagnosis not present

## 2020-04-13 DIAGNOSIS — M5440 Lumbago with sciatica, unspecified side: Secondary | ICD-10-CM | POA: Diagnosis not present

## 2020-04-13 DIAGNOSIS — R7303 Prediabetes: Secondary | ICD-10-CM | POA: Diagnosis not present

## 2020-04-13 DIAGNOSIS — Z125 Encounter for screening for malignant neoplasm of prostate: Secondary | ICD-10-CM | POA: Diagnosis not present

## 2020-04-27 DIAGNOSIS — M5136 Other intervertebral disc degeneration, lumbar region: Secondary | ICD-10-CM | POA: Diagnosis not present

## 2020-04-27 DIAGNOSIS — M1711 Unilateral primary osteoarthritis, right knee: Secondary | ICD-10-CM | POA: Diagnosis not present

## 2020-04-27 DIAGNOSIS — M7651 Patellar tendinitis, right knee: Secondary | ICD-10-CM | POA: Diagnosis not present

## 2020-05-23 DIAGNOSIS — M222X1 Patellofemoral disorders, right knee: Secondary | ICD-10-CM | POA: Diagnosis not present

## 2020-05-23 DIAGNOSIS — M1711 Unilateral primary osteoarthritis, right knee: Secondary | ICD-10-CM | POA: Diagnosis not present

## 2020-05-23 DIAGNOSIS — M5136 Other intervertebral disc degeneration, lumbar region: Secondary | ICD-10-CM | POA: Diagnosis not present

## 2020-06-10 DIAGNOSIS — R238 Other skin changes: Secondary | ICD-10-CM | POA: Diagnosis not present

## 2020-11-11 DIAGNOSIS — D225 Melanocytic nevi of trunk: Secondary | ICD-10-CM | POA: Diagnosis not present

## 2020-11-11 DIAGNOSIS — D1801 Hemangioma of skin and subcutaneous tissue: Secondary | ICD-10-CM | POA: Diagnosis not present

## 2020-11-11 DIAGNOSIS — L821 Other seborrheic keratosis: Secondary | ICD-10-CM | POA: Diagnosis not present

## 2020-11-11 DIAGNOSIS — L814 Other melanin hyperpigmentation: Secondary | ICD-10-CM | POA: Diagnosis not present

## 2020-11-11 DIAGNOSIS — L820 Inflamed seborrheic keratosis: Secondary | ICD-10-CM | POA: Diagnosis not present

## 2021-03-08 DIAGNOSIS — H353132 Nonexudative age-related macular degeneration, bilateral, intermediate dry stage: Secondary | ICD-10-CM | POA: Diagnosis not present

## 2021-05-03 DIAGNOSIS — Z0000 Encounter for general adult medical examination without abnormal findings: Secondary | ICD-10-CM | POA: Diagnosis not present

## 2021-05-03 DIAGNOSIS — R7303 Prediabetes: Secondary | ICD-10-CM | POA: Diagnosis not present

## 2021-05-03 DIAGNOSIS — Z13220 Encounter for screening for lipoid disorders: Secondary | ICD-10-CM | POA: Diagnosis not present

## 2021-05-03 DIAGNOSIS — Z125 Encounter for screening for malignant neoplasm of prostate: Secondary | ICD-10-CM | POA: Diagnosis not present

## 2021-05-05 DIAGNOSIS — Z23 Encounter for immunization: Secondary | ICD-10-CM | POA: Diagnosis not present

## 2021-05-05 DIAGNOSIS — Z0000 Encounter for general adult medical examination without abnormal findings: Secondary | ICD-10-CM | POA: Diagnosis not present

## 2021-06-28 DIAGNOSIS — M25561 Pain in right knee: Secondary | ICD-10-CM | POA: Diagnosis not present

## 2021-10-18 ENCOUNTER — Other Ambulatory Visit: Payer: Self-pay | Admitting: Family Medicine

## 2021-10-18 ENCOUNTER — Ambulatory Visit
Admission: RE | Admit: 2021-10-18 | Discharge: 2021-10-18 | Disposition: A | Discharge status: Home / Self Care | Payer: Self-pay | Source: Ambulatory Visit | Attending: Family Medicine | Admitting: Family Medicine

## 2021-10-18 DIAGNOSIS — R058 Other specified cough: Secondary | ICD-10-CM

## 2024-04-05 ENCOUNTER — Emergency Department (HOSPITAL_COMMUNITY)
Admission: EM | Admit: 2024-04-05 | Discharge: 2024-04-05 | Disposition: A | Discharge status: Home / Self Care | Attending: Emergency Medicine | Admitting: Emergency Medicine

## 2024-04-05 ENCOUNTER — Other Ambulatory Visit: Payer: Self-pay

## 2024-04-05 ENCOUNTER — Encounter (HOSPITAL_COMMUNITY): Payer: Self-pay | Admitting: *Deleted

## 2024-04-05 ENCOUNTER — Emergency Department (HOSPITAL_COMMUNITY)

## 2024-04-05 DIAGNOSIS — E860 Dehydration: Secondary | ICD-10-CM | POA: Diagnosis not present

## 2024-04-05 DIAGNOSIS — I447 Left bundle-branch block, unspecified: Secondary | ICD-10-CM | POA: Diagnosis not present

## 2024-04-05 DIAGNOSIS — R42 Dizziness and giddiness: Secondary | ICD-10-CM | POA: Diagnosis present

## 2024-04-05 LAB — TROPONIN I (HIGH SENSITIVITY)
Troponin I (High Sensitivity): 5 ng/L (ref <18)
Troponin I (High Sensitivity): 5 ng/L (ref <18)

## 2024-04-05 LAB — BASIC METABOLIC PANEL WITH GFR
Anion gap: 8 (ref 5–15)
BUN: 14 mg/dL (ref 8–23)
CO2: 25 mmol/L (ref 22–32)
Calcium: 8.6 mg/dL — ABNORMAL LOW (ref 8.9–10.3)
Chloride: 105 mmol/L (ref 98–111)
Creatinine, Ser: 1.03 mg/dL (ref 0.61–1.24)
GFR, Estimated: >60 mL/min (ref >60)
Glucose, Bld: 107 mg/dL — ABNORMAL HIGH (ref 70–99)
Potassium: 4 mmol/L (ref 3.5–5.1)
Sodium: 138 mmol/L (ref 135–145)

## 2024-04-05 LAB — URINALYSIS, ROUTINE W REFLEX MICROSCOPIC
Bilirubin Urine: NEGATIVE
Glucose, UA: NEGATIVE mg/dL
Hgb urine dipstick: NEGATIVE
Ketones, ur: 5 mg/dL — AB
Leukocytes,Ua: NEGATIVE
Nitrite: NEGATIVE
Protein, ur: NEGATIVE mg/dL
Specific Gravity, Urine: 1.011 (ref 1.005–1.030)
pH: 5 (ref 5.0–8.0)

## 2024-04-05 LAB — CBC
HCT: 38.7 % — ABNORMAL LOW (ref 39.0–52.0)
Hemoglobin: 13 g/dL (ref 13.0–17.0)
MCH: 31.7 pg (ref 26.0–34.0)
MCHC: 33.6 g/dL (ref 30.0–36.0)
MCV: 94.4 fL (ref 80.0–100.0)
Platelets: 196 K/uL (ref 150–400)
RBC: 4.1 MIL/uL — ABNORMAL LOW (ref 4.22–5.81)
RDW: 13.6 % (ref 11.5–15.5)
WBC: 6.3 K/uL (ref 4.0–10.5)
nRBC: 0 % (ref 0.0–0.2)

## 2024-04-05 LAB — D-DIMER, QUANTITATIVE: D-Dimer, Quant: <0.27 ug{FEU}/mL (ref 0.00–0.50)

## 2024-04-05 MED ORDER — SODIUM CHLORIDE 0.9 % IV BOLUS
1000.0000 mL | Freq: Once | INTRAVENOUS | Status: AC
  Administered 2024-04-05: 1000 mL via INTRAVENOUS

## 2024-04-05 NOTE — ED Provider Notes (Signed)
 [place] EMERGENCY DEPARTMENT AT [facility] Provider Note   CSN: [id number] Arrival date & time: 04/05/24  1534     Patient presents with: Dizziness   Rodney Quinn is a 68 y.o. male.   Pt is a 68 yo male with pmhx significant for hld.  Pt said he golfed 3 days in a row which was a little unusual for him.  He has some dizziness and felt like he was going to pass out, so he called EMS.  He had an EKG and was told it was abnormal and that he needed to come to the ED.  Pt is feeling better now.         Prior to Admission medications   Medication Sig Start Date End Date Taking? Authorizing Provider  beta carotene w/minerals (OCUVITE) tablet Take 1 tablet by mouth daily.    [provider]    Allergies: Patient has no known allergies.    Review of Systems  Neurological:  Positive for dizziness.  All other systems reviewed and are negative.   Updated Vital Signs BP (!) 173/83 (BP Location: Right Arm)   Pulse (!) 54   Temp 99.1 F (37.3 C)   Resp 16   Ht 5' 7 (1.702 m)   Wt 102.5 kg   SpO2 99%   BMI 35.39 kg/m   Physical Exam Vitals and nursing note reviewed.  Constitutional:      Appearance: Normal appearance.  HENT:     Head: Normocephalic and atraumatic.     Right Ear: External ear normal.     Left Ear: External ear normal.     Nose: Nose normal.     Mouth/Throat:     Mouth: Mucous membranes are moist.     Pharynx: Oropharynx is clear.  Eyes:     Extraocular Movements: Extraocular movements intact.     Conjunctiva/sclera: Conjunctivae normal.     Pupils: Pupils are equal, round, and reactive to light.  Cardiovascular:     Rate and Rhythm: Normal rate and regular rhythm.     Pulses: Normal pulses.     Heart sounds: Normal heart sounds.  Pulmonary:     Effort: Pulmonary effort is normal.     Breath sounds: Normal breath sounds.  Abdominal:     General: Abdomen is flat. Bowel sounds are normal.     Palpations: Abdomen is soft.   Musculoskeletal:        General: Normal range of motion.     Cervical back: Normal range of motion and neck supple.  Skin:    General: Skin is warm.     Capillary Refill: Capillary refill takes less than 2 seconds.  Neurological:     General: No focal deficit present.     Mental Status: He is alert and oriented to person, place, and time.  Psychiatric:        Mood and Affect: Mood normal.        Behavior: Behavior normal.     (all labs ordered are listed, but only abnormal results are displayed) Labs Reviewed  BASIC METABOLIC PANEL WITH GFR - Abnormal; Notable for the following components:      Result Value   Glucose, Bld 107 (*)    Calcium 8.6 (*)    All other components within normal limits  CBC - Abnormal; Notable for the following components:   RBC 4.10 (*)    HCT 38.7 (*)    All other components within normal limits  URINALYSIS,  ROUTINE W REFLEX MICROSCOPIC - Abnormal; Notable for the following components:   Ketones, ur 5 (*)    All other components within normal limits  D-DIMER, QUANTITATIVE  TROPONIN I (HIGH SENSITIVITY)  TROPONIN I (HIGH SENSITIVITY)    EKG: EKG Interpretation Date/Time:  Sunday April 05 2024 15:42:10 EDT Ventricular Rate:  62 PR Interval:  156 QRS Duration:  146 QT Interval:  470 QTC Calculation: 477 R Axis:   37  Text Interpretation: Normal sinus rhythm Left bundle branch block Abnormal ECG No previous ECGs available Confirmed by Dean Clarity [phone]) on 04/05/2024 4:29:52 PM  Radiology: DG Chest 2 View Result Date: 04/05/2024 CLINICAL DATA:  Dizziness earlier today. EXAM: CHEST - 2 VIEW COMPARISON:  10/18/2021 FINDINGS: The cardiomediastinal contours are stable. The lungs are clear. Pulmonary vasculature is normal. No consolidation, pleural effusion, or pneumothorax. No acute osseous abnormalities are seen. Arms down positioning on the PA view results and soft tissue attenuation laterally. Telemetry leads overlie the chest. IMPRESSION:  No active cardiopulmonary disease. Electronically Signed   By: Andrea Gasman M.D.   On: 04/05/2024 16:50     Procedures   Medications Ordered in the ED  sodium chloride 0.9 % bolus 1,000 mL (1,000 mLs Intravenous New Bag/Given 04/05/24 1715)                                    Medical Decision Making Amount and/or Complexity of Data Reviewed Labs: ordered.   This patient presents to the ED for concern of dizziness, this involves an extensive number of treatment options, and is a complaint that carries with it a high risk of complications and morbidity.  The differential diagnosis includes cardiac event, neurologic event, electrolyte abn, anemia, dehydration   Co morbidities that complicate the patient evaluation  hld   Additional history obtained:  Additional history obtained from epic chart review  Lab Tests:  I Ordered, and personally interpreted labs.  The pertinent results include:  cbc nl, bmp nl, trop nl times 2, ua nl except for ketones, ddimer neg   Imaging Studies ordered:  I ordered imaging studies including cxr  I independently visualized and interpreted imaging which showed No active cardiopulmonary disease.  I agree with the radiologist interpretation   Cardiac Monitoring:  The patient was maintained on a cardiac monitor.  I personally viewed and interpreted the cardiac monitored which showed an underlying rhythm of: sb   Medicines ordered and prescription drug management:  I ordered medication including ivfs  for sx  Reevaluation of the patient after these medicines showed that the patient improved I have reviewed the patients home medicines and have made adjustments as needed   Test Considered:  ct    Problem List / ED Course:  Dizziness:  pt does have ketones in his urine and has been outside playing golf a lot recently.  I suspect dizziness is due to dehydration.  Pt does have a LBBB on EKG, but no old to compare.  He does not remember  getting an EKG.  Pt referred to cards.    Reevaluation:  After the interventions noted above, I reevaluated the patient and found that they have :improved   Social Determinants of Health:  Lives at home   Dispostion:  After consideration of the diagnostic results and the patients response to treatment, I feel that the patent would benefit from discharge with outpatient f/u.  Final diagnoses:  LBBB (left bundle branch block)  Dehydration    ED Discharge Orders          Ordered    Ambulatory referral to Cardiology        04/05/24 1948               Dean Clarity, MD 04/05/24 1948

## 2024-04-05 NOTE — ED Triage Notes (Signed)
 Pt ambulatory to triage reporting abnormal EKG. Pt states, I was feeling lightheaded a few hours ago, so I called EMS to get checked for a stroke, and they said I was good but my EKG was abnormal. Denies current dizziness, lightheadedness, vision changes, weakness, numbness.

## 2024-05-08 ENCOUNTER — Encounter: Payer: Self-pay | Admitting: Cardiology

## 2024-05-08 ENCOUNTER — Ambulatory Visit: Attending: Cardiology | Admitting: Cardiology

## 2024-05-08 VITALS — BP 165/78 | HR 61 | Ht 66.5 in | Wt 211.0 lb

## 2024-05-08 DIAGNOSIS — I10 Essential (primary) hypertension: Secondary | ICD-10-CM | POA: Insufficient documentation

## 2024-05-08 DIAGNOSIS — E782 Mixed hyperlipidemia: Secondary | ICD-10-CM | POA: Insufficient documentation

## 2024-05-08 DIAGNOSIS — I447 Left bundle-branch block, unspecified: Secondary | ICD-10-CM | POA: Diagnosis not present

## 2024-05-08 MED ORDER — LOSARTAN POTASSIUM-HCTZ 50-12.5 MG PO TABS: 1.0000 | ORAL_TABLET | Freq: Every day | ORAL | 3 refills | Status: DC

## 2024-05-08 NOTE — Progress Notes (Signed)
 Cardiology Office Note:  .   Date:  05/08/2024  ID:  Rodney Quinn, DOB 04-24-1956, MRN [id number] PCP: Okey Carlin Redbird, MD  [facility] Providers Cardiologist:  Newman Lawrence, MD PCP: Okey Carlin Redbird, MD  Chief Complaint  Patient presents with   Left bundle branch block     Rodney Quinn is a 68 y.o. male with hypertension, hyperlipidemia, difficult intentional, family history of CAD  Discussed the use of AI scribe software for clinical note transcription with the patient, who gave verbal consent to proceed.  History of Present Illness Rodney Quinn is a 68 year old male who presents with an episode of lightheadedness.  Approximately one month ago, he experienced lightheadedness after a golf tournament, which worsened upon sitting in the bathroom. Paramedics noted a ventricular issue. He occasionally feels lightheaded but without chest pain, shortness of breath, or syncope.  He is active, playing golf and doing yard work. His Fitbit shows a heart rate increase to the 150s during yard work, prompting him to slow down. No chest pain or tightness occurs during these activities.  His father had multiple heart attacks due to high cholesterol and an unhealthy lifestyle. He recently had his cholesterol checked.  He frequently uses marijuana, has quit alcohol, and occasionally drinks one type of beer. He consumes two to four cups of coffee daily and maintains a diet focused on chicken, avoiding red meat and high saturated fats.  He has high blood pressure readings, including 173 at the emergency room and 165/78 today. He is managing his blood pressure through weight loss, exercise, and diet monitoring.      Vitals:   05/08/24 0857  BP: (!) 165/78  Pulse: 61  SpO2: 98%      Review of Systems  Cardiovascular:  Negative for chest pain, dyspnea on exertion, leg swelling, palpitations and syncope.  Neurological:  Positive for dizziness (Currently resolved).         Studies Reviewed: SABRA        EKG 05/08/2024: Normal sinus rhythm Left bundle branch block When compared with ECG of 05-Apr-2024 15:42, T wave inversion no longer evident in Inferior leads   EKG 04/05/2024: Sinus rhythm 62 bpm LBBB  Labs 04/2024: Chol 197, TG 117, HDL 47, LDL 98 HbA1C 5.9% Hb 13.0 Cr 1.03 Trop HS 5, 5   Physical Exam Vitals and nursing note reviewed.  Constitutional:      General: He is not in acute distress. Neck:     Vascular: No JVD.  Cardiovascular:     Rate and Rhythm: Normal rate and regular rhythm.     Heart sounds: Normal heart sounds. No murmur heard. Pulmonary:     Effort: Pulmonary effort is normal.     Breath sounds: Normal breath sounds. No wheezing or rales.  Musculoskeletal:     Right lower leg: No edema.     Left lower leg: No edema.      VISIT DIAGNOSES:   ICD-10-CM   1. LBBB (left bundle branch block)  I44.7 EKG 12-Lead    ECHOCARDIOGRAM COMPLETE    2. Mixed hyperlipidemia  E78.2 CT CARDIAC SCORING (SELF PAY ONLY)    3. Primary hypertension  I10 Basic Metabolic Panel (BMET)       Rodney Quinn is a 68 y.o. male with  hypertension, hyperlipidemia, difficult intentional, family history of CAD Assessment & Plan Dizziness: Likely related to hypertension, currently not on any antihypertensive therapy.  See below.  Hypertension: Stage II.  Blood pressure is  elevated during recent ER visit, as well as during his visit with Dr. Okey. Left bundle branch block suggests longstanding hypertension.  In addition to heart healthy diet transfer, reducing caffeine intake, I do think would benefit from antihypertensive therapy. Recommend starting losartan lisinopril thiazide 50-12.5 mg daily. Recommend liberal hydration. Check BMP in 1 week. If blood pressure remains >130/80 mmHg, could consider increasing losartan lisinopril thiazide dose before adding another agent such as amlodipine..  Mixed hyperlipidemia: LDL 129.  Family  history of CAD. Check calcium score. If calcium score 0, continue diet and lifestyle modifications and repeat lipid panel in 3 months. If calcium score elevated, recommend adding high intensity statin therapy.      Meds ordered this encounter  Medications   losartan-hydrochlorothiazide (HYZAAR) 50-12.5 MG tablet    Sig: Take 1 tablet by mouth daily.    Dispense:  30 tablet    Refill:  3     F/u in 6 weeks to discuss test results and reassess blood pressure control.  Signed, Newman JINNY Lawrence, MD

## 2024-05-08 NOTE — Patient Instructions (Signed)
 Medication Instructions:  Your physician has recommended you make the following change in your medication:  START: Losartan -Hydrochlorothiazide 50-12.5 mg once daily  *If you need a refill on your cardiac medications before your next appointment, please call your pharmacy*  Lab Work: In 1 week: BMET If you have labs (blood work) drawn today and your tests are completely normal, you will receive your results only by: MyChart Message (if you have MyChart) OR A paper copy in the mail If you have any lab test that is abnormal or we need to change your treatment, we will call you to review the results.  Testing/Procedures: Your physician has requested that you have an echocardiogram (before follow up appointment). Echocardiography is a painless test that uses sound waves to create images of your heart. It provides your doctor with information about the size and shape of your heart and how well your heart's chambers and valves are working. This procedure takes approximately one hour. There are no restrictions for this procedure. Please do NOT wear cologne, perfume, aftershave, or lotions (deodorant is allowed). Please arrive 15 minutes prior to your appointment time.  Please note: We ask at that you not bring children with you during ultrasound (echo/ vascular) testing. Due to room size and safety concerns, children are not allowed in the ultrasound rooms during exams. Our front office staff cannot provide observation of children in our lobby area while testing is being conducted. An adult accompanying a patient to their appointment will only be allowed in the ultrasound room at the discretion of the ultrasound technician under special circumstances. We apologize for any inconvenience.  CT coronary calcium score.   This is $99 out of pocket.   Coronary CalciumScan A coronary calcium scan is an imaging test used to look for deposits of calcium and other fatty materials (plaques) in the inner  lining of the blood vessels of the heart (coronary arteries). These deposits of calcium and plaques can partly clog and narrow the coronary arteries without producing any symptoms or warning signs. This puts a person at risk for a heart attack. This test can detect these deposits before symptoms develop. Tell a health care provider about: Any allergies you have. All medicines you are taking, including vitamins, herbs, eye drops, creams, and over-the-counter medicines. Any problems you or family members have had with anesthetic medicines. Any blood disorders you have. Any surgeries you have had. Any medical conditions you have. Whether you are pregnant or may be pregnant. What are the risks? Generally, this is a safe procedure. However, problems may occur, including: Harm to a pregnant woman and her unborn baby. This test involves the use of radiation. Radiation exposure can be dangerous to a pregnant woman and her unborn baby. If you are pregnant, you generally should not have this procedure done. Slight increase in the risk of cancer. This is because of the radiation involved in the test. What happens before the procedure? No preparation is needed for this procedure. What happens during the procedure? You will undress and remove any jewelry around your neck or chest. You will put on a hospital gown. Sticky electrodes will be placed on your chest. The electrodes will be connected to an electrocardiogram (ECG) machine to record a tracing of the electrical activity of your heart. A CT scanner will take pictures of your heart. During this time, you will be asked to lie still and hold your breath for 2-3 seconds while a picture of your heart is being taken.  The procedure may vary among health care providers and hospitals. What happens after the procedure? You can get dressed. You can return to your normal activities. It is up to you to get the results of your test. Ask your health care provider,  or the department that is doing the test, when your results will be ready. Summary A coronary calcium scan is an imaging test used to look for deposits of calcium and other fatty materials (plaques) in the inner lining of the blood vessels of the heart (coronary arteries). Generally, this is a safe procedure. Tell your health care provider if you are pregnant or may be pregnant. No preparation is needed for this procedure. A CT scanner will take pictures of your heart. You can return to your normal activities after the scan is done. This information is not intended to replace advice given to you by your health care provider. Make sure you discuss any questions you have with your health care provider. Document Released: 12/15/2007 Document Revised: 05/07/2016 Document Reviewed: 05/07/2016 Elsevier Interactive Patient Education  2017 [employer].   Follow-Up: At [facility], you and your health needs are our priority.  As part of our continuing mission to provide you with exceptional heart care, our providers are all part of one team.  This team includes your primary Cardiologist (physician) and Advanced Practice Providers or APPs (Physician Assistants and Nurse Practitioners) who all work together to provide you with the care you need, when you need it.  Your next appointment:   6 week(s)  Provider:   Newman JINNY Lawrence, MD  or APP

## 2024-05-12 ENCOUNTER — Ambulatory Visit (HOSPITAL_COMMUNITY)
Admission: RE | Admit: 2024-05-12 | Discharge: 2024-05-12 | Disposition: A | Discharge status: Home / Self Care | Payer: Self-pay | Source: Ambulatory Visit | Attending: Cardiovascular Disease | Admitting: Cardiovascular Disease

## 2024-05-12 DIAGNOSIS — E782 Mixed hyperlipidemia: Secondary | ICD-10-CM | POA: Insufficient documentation

## 2024-05-14 ENCOUNTER — Ambulatory Visit: Payer: Self-pay | Admitting: Cardiology

## 2024-05-14 DIAGNOSIS — E782 Mixed hyperlipidemia: Secondary | ICD-10-CM

## 2024-05-14 NOTE — Progress Notes (Signed)
 Moderately high calcium score.  Recommend Crestor 20 mg daily with goal LDL <70. Repeat lipid panel in 3 months.  Thanks MJP

## 2024-05-18 ENCOUNTER — Encounter: Payer: Self-pay | Admitting: Cardiology

## 2024-05-20 NOTE — Telephone Encounter (Signed)
 Returning call to a nurse.

## 2024-05-21 MED ORDER — ROSUVASTATIN CALCIUM 20 MG PO TABS: 20.0000 mg | ORAL_TABLET | Freq: Every day | ORAL | 0 refills | Status: DC

## 2024-05-22 MED ORDER — ROSUVASTATIN CALCIUM 20 MG PO TABS: 20.0000 mg | ORAL_TABLET | Freq: Every day | ORAL | 3 refills | Status: AC

## 2024-06-11 ENCOUNTER — Ambulatory Visit (HOSPITAL_COMMUNITY)
Admission: RE | Admit: 2024-06-11 | Discharge: 2024-06-11 | Disposition: A | Source: Ambulatory Visit | Attending: Cardiology

## 2024-06-11 DIAGNOSIS — E785 Hyperlipidemia, unspecified: Secondary | ICD-10-CM | POA: Diagnosis not present

## 2024-06-11 DIAGNOSIS — Z87891 Personal history of nicotine dependence: Secondary | ICD-10-CM | POA: Insufficient documentation

## 2024-06-11 DIAGNOSIS — I358 Other nonrheumatic aortic valve disorders: Secondary | ICD-10-CM | POA: Diagnosis not present

## 2024-06-11 DIAGNOSIS — Z8249 Family history of ischemic heart disease and other diseases of the circulatory system: Secondary | ICD-10-CM | POA: Insufficient documentation

## 2024-06-11 DIAGNOSIS — I2510 Atherosclerotic heart disease of native coronary artery without angina pectoris: Secondary | ICD-10-CM | POA: Insufficient documentation

## 2024-06-11 DIAGNOSIS — I447 Left bundle-branch block, unspecified: Secondary | ICD-10-CM | POA: Insufficient documentation

## 2024-06-11 DIAGNOSIS — I340 Nonrheumatic mitral (valve) insufficiency: Secondary | ICD-10-CM | POA: Insufficient documentation

## 2024-06-11 DIAGNOSIS — R0609 Other forms of dyspnea: Secondary | ICD-10-CM | POA: Insufficient documentation

## 2024-06-11 DIAGNOSIS — I10 Essential (primary) hypertension: Secondary | ICD-10-CM | POA: Insufficient documentation

## 2024-06-11 LAB — ECHOCARDIOGRAM COMPLETE
AR max vel: 2.64 cm2
AV Area VTI: 2.35 cm2
AV Area mean vel: 2.24 cm2
AV Mean grad: 8 mmHg
AV Peak grad: 13.8 mmHg
Ao pk vel: 1.86 m/s
Area-P 1/2: 3.08 cm2
S' Lateral: 3.24 cm

## 2024-06-16 NOTE — Progress Notes (Unsigned)
 Cardiology Clinic Note   Patient Name: Rodney Quinn Date of Encounter: 06/16/2024  Primary Care Provider:  Okey Carlin Redbird, MD Primary Cardiologist:  Newman JINNY Lawrence, MD  Patient Profile    Rodney Quinn 68 year old male presents to clinic today for follow-up evaluation of his  Past Medical History    Past Medical History:  Diagnosis Date   Chronic back pain    Hyperlipidemia    No past surgical history on file.  Allergies  Allergies[1]  History of Present Illness    ***  Home Medications    Prior to Admission medications  Medication Sig Start Date End Date Taking? Authorizing Provider  beta carotene w/minerals (OCUVITE) tablet Take 1 tablet by mouth daily.    [provider]  losartan -hydrochlorothiazide (HYZAAR) 50-12.5 MG tablet Take 1 tablet by mouth daily. 05/08/24   Patwardhan, Newman JINNY, MD  rosuvastatin  (CRESTOR ) 20 MG tablet Take 1 tablet (20 mg total) by mouth daily. 05/22/24 08/20/24  Patwardhan, Manish J, MD    Family History    Family History  Problem Relation Age of Onset   Cancer Mother        colon   He indicated that his mother is deceased. He indicated that his father is deceased.  Social History    Social History   Socioeconomic History   Marital status: Single    Spouse name: Not on file   Number of children: Not on file   Years of education: Not on file   Highest education level: Not on file  Occupational History   Not on file  Tobacco Use   Smoking status: Former   Smokeless tobacco: Current    Types: Snuff  Substance and Sexual Activity   Alcohol use: Yes    Comment: rarely   Drug use: No   Sexual activity: Not on file  Other Topics Concern   Not on file  Social History Narrative   Not on file   Social Drivers of Health   Tobacco Use: High Risk (05/08/2024)   Patient History    Smoking Tobacco Use: Former    Smokeless Tobacco Use: Current    Passive Exposure: Not on Actuary Strain:  Not on file  Food Insecurity: Not on file  Transportation Needs: Not on file  Physical Activity: Not on file  Stress: Not on file  Social Connections: Not on file  Intimate Partner Violence: Not on file  Depression (EYV7-0): Not on file  Alcohol Screen: Not on file  Housing: Not on file  Utilities: Not on file  Health Literacy: Not on file     Review of Systems    General:  No chills, fever, night sweats or weight changes.  Cardiovascular:  No chest pain, dyspnea on exertion, edema, orthopnea, palpitations, paroxysmal nocturnal dyspnea. Dermatological: No rash, lesions/masses Respiratory: No cough, dyspnea Urologic: No hematuria, dysuria Abdominal:   No nausea, vomiting, diarrhea, bright red blood per rectum, melena, or hematemesis Neurologic:  No visual changes, wkns, changes in mental status. All other systems reviewed and are otherwise negative except as noted above.  Physical Exam    VS:  There were no vitals taken for this visit. , BMI There is no height or weight on file to calculate BMI. GEN: Well nourished, well developed, in no acute distress. HEENT: normal. Neck: Supple, no JVD, carotid bruits, or masses. Cardiac: RRR, no murmurs, rubs, or gallops. No clubbing, cyanosis, edema.  Radials/DP/PT 2+ and equal bilaterally.  Respiratory:  Respirations regular and unlabored, clear to auscultation bilaterally. GI: Soft, nontender, nondistended, BS + x 4. MS: no deformity or atrophy. Skin: warm and dry, no rash. Neuro:  Strength and sensation are intact. Psych: Normal affect.  Accessory Clinical Findings    Recent Labs: 04/05/2024: BUN 14; Creatinine, Ser 1.03; Hemoglobin 13.0; Platelets 196; Potassium 4.0; Sodium 138   Recent Lipid Panel No results found for: CHOL, TRIG, HDL, CHOLHDL, VLDL, LDLCALC, LDLDIRECT  No BP recorded.  {Refresh Note OR Click here to enter BP  :1}***    ECG personally reviewed by me today- ***          Assessment & Plan    1.  ***   Josefa HERO. Nikky Duba NP-C     06/16/2024, 8:39 AM [facility] [address] [place], [place] [address] Office [phone]    Notice: This dictation was prepared with Dragon dictation along with smaller phrase technology. Any transcriptional errors that result from this process are unintentional and may not be corrected upon review.   I spent***minutes examining this patient, reviewing medications, and using patient centered shared decision making involving their cardiac care.   I spent  20 minutes reviewing past medical history,  medications, and prior cardiac tests.     [1] No Known Allergies

## 2024-06-19 ENCOUNTER — Encounter: Payer: Self-pay | Admitting: General Practice

## 2024-06-19 ENCOUNTER — Ambulatory Visit: Attending: General Practice | Admitting: General Practice

## 2024-06-19 VITALS — BP 114/72 | HR 70 | Ht 67.0 in | Wt 213.0 lb

## 2024-06-19 DIAGNOSIS — E782 Mixed hyperlipidemia: Secondary | ICD-10-CM | POA: Diagnosis not present

## 2024-06-19 DIAGNOSIS — I10 Essential (primary) hypertension: Secondary | ICD-10-CM | POA: Diagnosis not present

## 2024-06-19 DIAGNOSIS — I447 Left bundle-branch block, unspecified: Secondary | ICD-10-CM

## 2024-06-19 DIAGNOSIS — I2510 Atherosclerotic heart disease of native coronary artery without angina pectoris: Secondary | ICD-10-CM

## 2024-06-19 NOTE — Patient Instructions (Signed)
 Medication Instructions:  Your physician recommends that you continue on your current medications as directed. Please refer to the Current Medication list given to you today.  *If you need a refill on your cardiac medications before your next appointment, please call your pharmacy*  Lab Work: Fasting Lipid Panel and LFT in 6 weeks  If you have labs (blood work) drawn today and your tests are completely normal, you will receive your results only by: MyChart Message (if you have MyChart) OR A paper copy in the mail If you have any lab test that is abnormal or we need to change your treatment, we will call you to review the results.  Testing/Procedures: NONE ORDERED  Follow-Up: At [facility], you and your health needs are our priority.  As part of our continuing mission to provide you with exceptional heart care, our providers are all part of one team.  This team includes your primary Cardiologist (physician) and Advanced Practice Providers or APPs (Physician Assistants and Nurse Practitioners) who all work together to provide you with the care you need, when you need it.  Your next appointment:   6-9 month(s)  Provider:   Newman JINNY Lawrence, MD or any APP   We recommend signing up for the patient portal called MyChart.  Sign up information is provided on this After Visit Summary.  MyChart is used to connect with patients for Virtual Visits (Telemedicine).  Patients are able to view lab/test results, encounter notes, upcoming appointments, etc.  Non-urgent messages can be sent to your provider as well.   To learn more about what you can do with MyChart, go to [email or web address].   Other Instructions 150 Minutes of exercise weekly Blood pressure readings at home will read 10 points higher or 10 points lower

## 2024-06-30 ENCOUNTER — Other Ambulatory Visit: Payer: Self-pay | Admitting: Cardiology
